# Patient Record
Sex: Female | Born: 1965 | Race: White | Hispanic: No | Marital: Married | Smoking: Never smoker
Health system: Southern US, Community
[De-identification: ages and names within clinical notes are randomized; demographics above are authoritative.]

## PROBLEM LIST (undated history)

## (undated) DIAGNOSIS — F32A Depression, unspecified: Secondary | ICD-10-CM

## (undated) DIAGNOSIS — F419 Anxiety disorder, unspecified: Secondary | ICD-10-CM

## (undated) DIAGNOSIS — F329 Major depressive disorder, single episode, unspecified: Secondary | ICD-10-CM

## (undated) DIAGNOSIS — Z8632 Personal history of gestational diabetes: Secondary | ICD-10-CM

## (undated) DIAGNOSIS — E7800 Pure hypercholesterolemia, unspecified: Secondary | ICD-10-CM

## (undated) HISTORY — DX: Depression, unspecified: F32.A

## (undated) HISTORY — DX: Major depressive disorder, single episode, unspecified: F32.9

## (undated) HISTORY — DX: Anxiety disorder, unspecified: F41.9

## (undated) HISTORY — DX: Personal history of gestational diabetes: Z86.32

## (undated) HISTORY — DX: Pure hypercholesterolemia, unspecified: E78.00

---

## 1998-12-23 ENCOUNTER — Inpatient Hospital Stay (HOSPITAL_COMMUNITY): Admission: AD | Admit: 1998-12-23 | Discharge: 1998-12-23 | Payer: Self-pay | Admitting: Gynecology

## 1999-01-25 ENCOUNTER — Encounter: Admission: RE | Admit: 1999-01-25 | Discharge: 1999-04-25 | Payer: Self-pay | Admitting: Gynecology

## 1999-04-23 ENCOUNTER — Encounter: Payer: Self-pay | Admitting: Obstetrics and Gynecology

## 1999-04-23 ENCOUNTER — Ambulatory Visit (HOSPITAL_COMMUNITY): Admission: RE | Admit: 1999-04-23 | Discharge: 1999-04-23 | Payer: Self-pay | Admitting: Obstetrics and Gynecology

## 1999-04-24 ENCOUNTER — Inpatient Hospital Stay (HOSPITAL_COMMUNITY): Admission: AD | Admit: 1999-04-24 | Discharge: 1999-04-27 | Payer: Self-pay | Admitting: Gynecology

## 1999-04-28 ENCOUNTER — Encounter: Admission: RE | Admit: 1999-04-28 | Discharge: 1999-07-27 | Payer: Self-pay | Admitting: Gynecology

## 1999-07-28 ENCOUNTER — Encounter: Admission: RE | Admit: 1999-07-28 | Discharge: 1999-10-15 | Payer: Self-pay | Admitting: Gynecology

## 2000-11-06 ENCOUNTER — Encounter: Admission: RE | Admit: 2000-11-06 | Discharge: 2001-02-04 | Payer: Self-pay | Admitting: *Deleted

## 2002-05-05 ENCOUNTER — Other Ambulatory Visit: Admission: RE | Admit: 2002-05-05 | Discharge: 2002-05-05 | Payer: Self-pay | Admitting: Gynecology

## 2004-02-21 ENCOUNTER — Other Ambulatory Visit: Admission: RE | Admit: 2004-02-21 | Discharge: 2004-02-21 | Payer: Self-pay | Admitting: Gynecology

## 2004-03-11 ENCOUNTER — Ambulatory Visit (HOSPITAL_COMMUNITY): Admission: RE | Admit: 2004-03-11 | Discharge: 2004-03-11 | Payer: Self-pay | Admitting: Gynecology

## 2005-08-27 ENCOUNTER — Other Ambulatory Visit: Admission: RE | Admit: 2005-08-27 | Discharge: 2005-08-27 | Payer: Self-pay | Admitting: Gynecology

## 2006-12-25 ENCOUNTER — Ambulatory Visit (HOSPITAL_COMMUNITY): Admission: RE | Admit: 2006-12-25 | Discharge: 2006-12-25 | Payer: Self-pay | Admitting: Gynecology

## 2006-12-25 ENCOUNTER — Other Ambulatory Visit: Admission: RE | Admit: 2006-12-25 | Discharge: 2006-12-25 | Payer: Self-pay | Admitting: Gynecology

## 2007-12-27 ENCOUNTER — Ambulatory Visit (HOSPITAL_COMMUNITY): Admission: RE | Admit: 2007-12-27 | Discharge: 2007-12-27 | Payer: Self-pay | Admitting: Gynecology

## 2008-01-04 ENCOUNTER — Encounter: Payer: Self-pay | Admitting: Gynecology

## 2008-01-04 ENCOUNTER — Other Ambulatory Visit: Admission: RE | Admit: 2008-01-04 | Discharge: 2008-01-04 | Payer: Self-pay | Admitting: Gynecology

## 2008-01-04 ENCOUNTER — Ambulatory Visit: Payer: Self-pay | Admitting: Gynecology

## 2008-07-11 ENCOUNTER — Other Ambulatory Visit: Admission: RE | Admit: 2008-07-11 | Discharge: 2008-07-11 | Payer: Self-pay | Admitting: Gynecology

## 2008-07-11 ENCOUNTER — Encounter: Payer: Self-pay | Admitting: Gynecology

## 2008-07-11 ENCOUNTER — Ambulatory Visit: Payer: Self-pay | Admitting: Gynecology

## 2008-12-29 ENCOUNTER — Ambulatory Visit (HOSPITAL_COMMUNITY): Admission: RE | Admit: 2008-12-29 | Discharge: 2008-12-29 | Payer: Self-pay | Admitting: Gynecology

## 2009-01-04 ENCOUNTER — Encounter: Payer: Self-pay | Admitting: Gynecology

## 2009-01-04 ENCOUNTER — Other Ambulatory Visit: Admission: RE | Admit: 2009-01-04 | Discharge: 2009-01-04 | Payer: Self-pay | Admitting: Gynecology

## 2009-01-04 ENCOUNTER — Ambulatory Visit: Payer: Self-pay | Admitting: Gynecology

## 2009-01-09 ENCOUNTER — Encounter: Admission: RE | Admit: 2009-01-09 | Discharge: 2009-01-09 | Payer: Self-pay | Admitting: Gynecology

## 2009-06-01 ENCOUNTER — Ambulatory Visit: Payer: Self-pay | Admitting: Gynecology

## 2010-01-15 ENCOUNTER — Ambulatory Visit: Payer: Self-pay | Admitting: Gynecology

## 2010-01-15 ENCOUNTER — Other Ambulatory Visit: Admission: RE | Admit: 2010-01-15 | Discharge: 2010-01-15 | Payer: Self-pay | Admitting: Gynecology

## 2010-01-18 ENCOUNTER — Ambulatory Visit (HOSPITAL_COMMUNITY): Admission: RE | Admit: 2010-01-18 | Discharge: 2010-01-18 | Payer: Self-pay | Admitting: Gynecology

## 2010-11-04 ENCOUNTER — Encounter: Payer: Self-pay | Admitting: *Deleted

## 2011-10-15 ENCOUNTER — Other Ambulatory Visit: Payer: Self-pay | Admitting: Gynecology

## 2011-10-15 DIAGNOSIS — Z1231 Encounter for screening mammogram for malignant neoplasm of breast: Secondary | ICD-10-CM

## 2011-10-23 ENCOUNTER — Other Ambulatory Visit (HOSPITAL_COMMUNITY)
Admission: RE | Admit: 2011-10-23 | Discharge: 2011-10-23 | Disposition: A | Discharge status: Home / Self Care | Payer: BC Managed Care – PPO | Source: Ambulatory Visit | Attending: Gynecology | Admitting: Gynecology

## 2011-10-23 ENCOUNTER — Encounter: Payer: Self-pay | Admitting: Gynecology

## 2011-10-23 ENCOUNTER — Ambulatory Visit (INDEPENDENT_AMBULATORY_CARE_PROVIDER_SITE_OTHER): Payer: BC Managed Care – PPO | Admitting: Gynecology

## 2011-10-23 VITALS — BP 126/68 | Ht 65.0 in | Wt 200.0 lb

## 2011-10-23 DIAGNOSIS — Z1159 Encounter for screening for other viral diseases: Secondary | ICD-10-CM | POA: Insufficient documentation

## 2011-10-23 DIAGNOSIS — Z01419 Encounter for gynecological examination (general) (routine) without abnormal findings: Secondary | ICD-10-CM

## 2011-10-23 NOTE — Patient Instructions (Signed)
Follow up in one year for annual gynecologic exam. 

## 2011-10-23 NOTE — Progress Notes (Signed)
Krista Walsh 09-Sep-1965 [id number]        46 y.o.  for annual exam.  Doing well without complaints.  Past medical history,surgical history, medications, allergies, family history and social history were all reviewed and documented in the EPIC chart. ROS:  Was performed and pertinent positives and negatives are included in the history.  Exam: Sherrilyn Rist assistant Filed Vitals:   10/23/11 1106  BP: 126/68   General appearance  Normal Skin grossly normal Head/Neck normal with no cervical or supraclavicular adenopathy thyroid normal Lungs  clear Cardiac RR, without RMG Abdominal  soft, nontender, without masses, organomegaly or hernia Breasts  examined lying and sitting without masses, retractions, discharge or axillary adenopathy. Pelvic  Ext/BUS/vagina  normal   Cervix  normal Pap/HPV  Uterus  anteverted, normal size, shape and contour, midline and mobile nontender   Adnexa  Without masses or tenderness    Anus and perineum  normal   Rectovaginal  normal sphincter tone without palpated masses or tenderness.    Assessment/Plan:  46 y.o. female for annual exam, regular menses.    1. Contraception. Patient using condoms. I again reviewed with her the failure risk and availability of Plan B. Alternatives discussed and offered and patient declines. 2. Pap smear. Last Pap smear 2011. Pap/HPV done. No history of abnormal Pap smears before. If this is normal then we'll plan every 5 year screening. 3. Mammography. Patient has scheduled in 2 weeks. SBE monthly reviewed. 4. Health maintenance. The lab work was done today as it is all done through her primary physician's office. Assuming she continues well from a gynecologic standpoint she will see me in a year, sooner as needed.    Dara Lords MD, 11:24 AM 10/23/2011

## 2011-11-11 ENCOUNTER — Ambulatory Visit (HOSPITAL_COMMUNITY)
Admission: RE | Admit: 2011-11-11 | Discharge: 2011-11-11 | Disposition: A | Discharge status: Home / Self Care | Payer: BC Managed Care – PPO | Source: Ambulatory Visit | Attending: Gynecology | Admitting: Gynecology

## 2011-11-11 DIAGNOSIS — Z1231 Encounter for screening mammogram for malignant neoplasm of breast: Secondary | ICD-10-CM | POA: Insufficient documentation

## 2013-01-30 IMAGING — MG MM DIGITAL SCREENING BILAT
4 series · 4 of 4 positions shown · non-contrast
Comparison: Previous exams

CLINICAL DATA: Screening.

DIGITAL SCREENING MAMMOGRAM WITH CAD

[R CC]
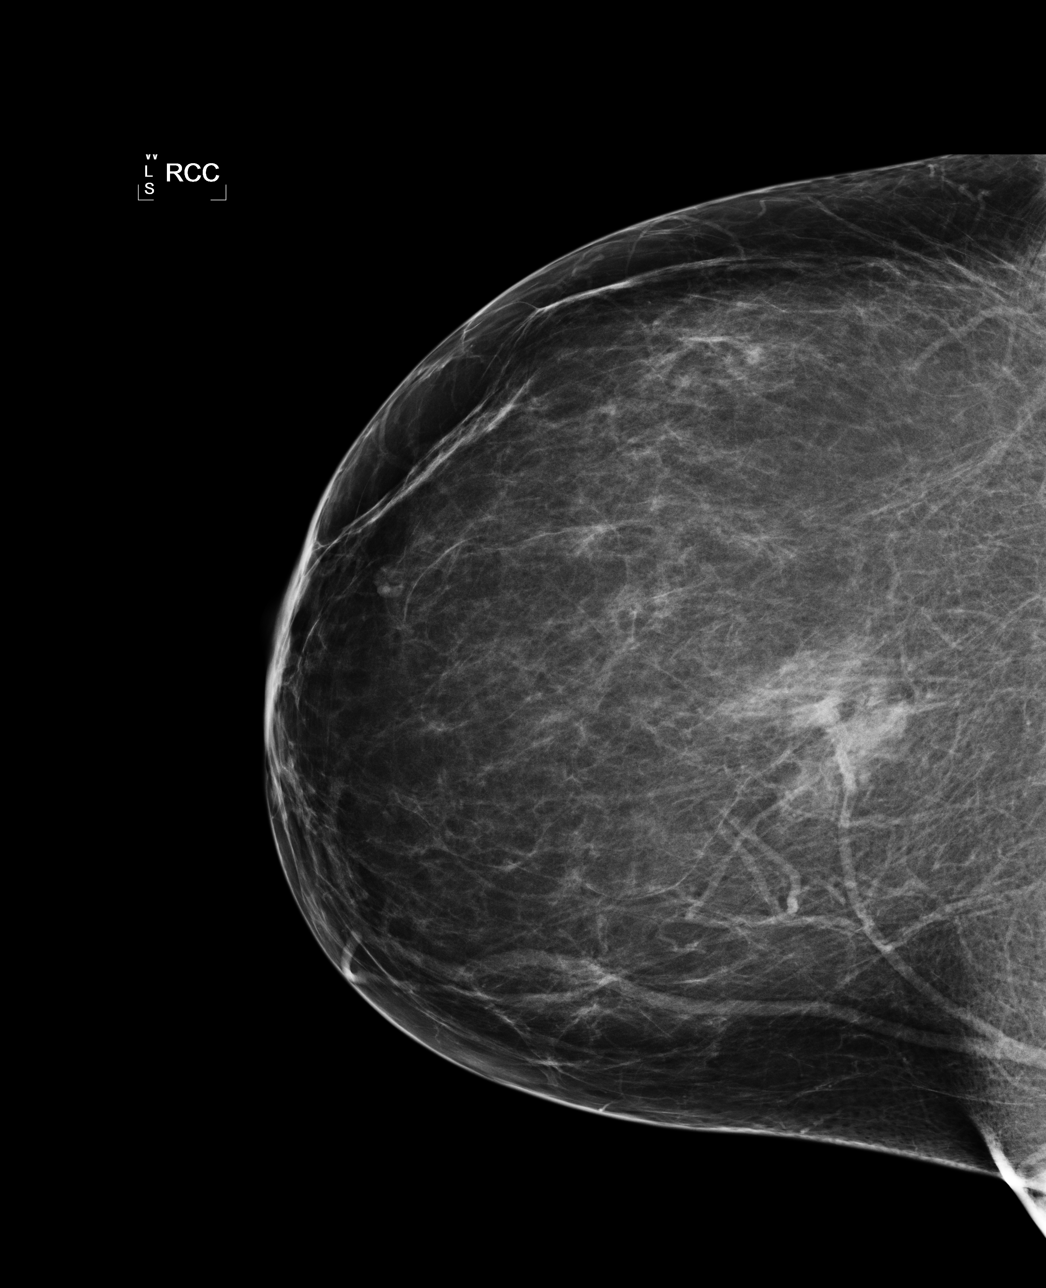

[R MLO]
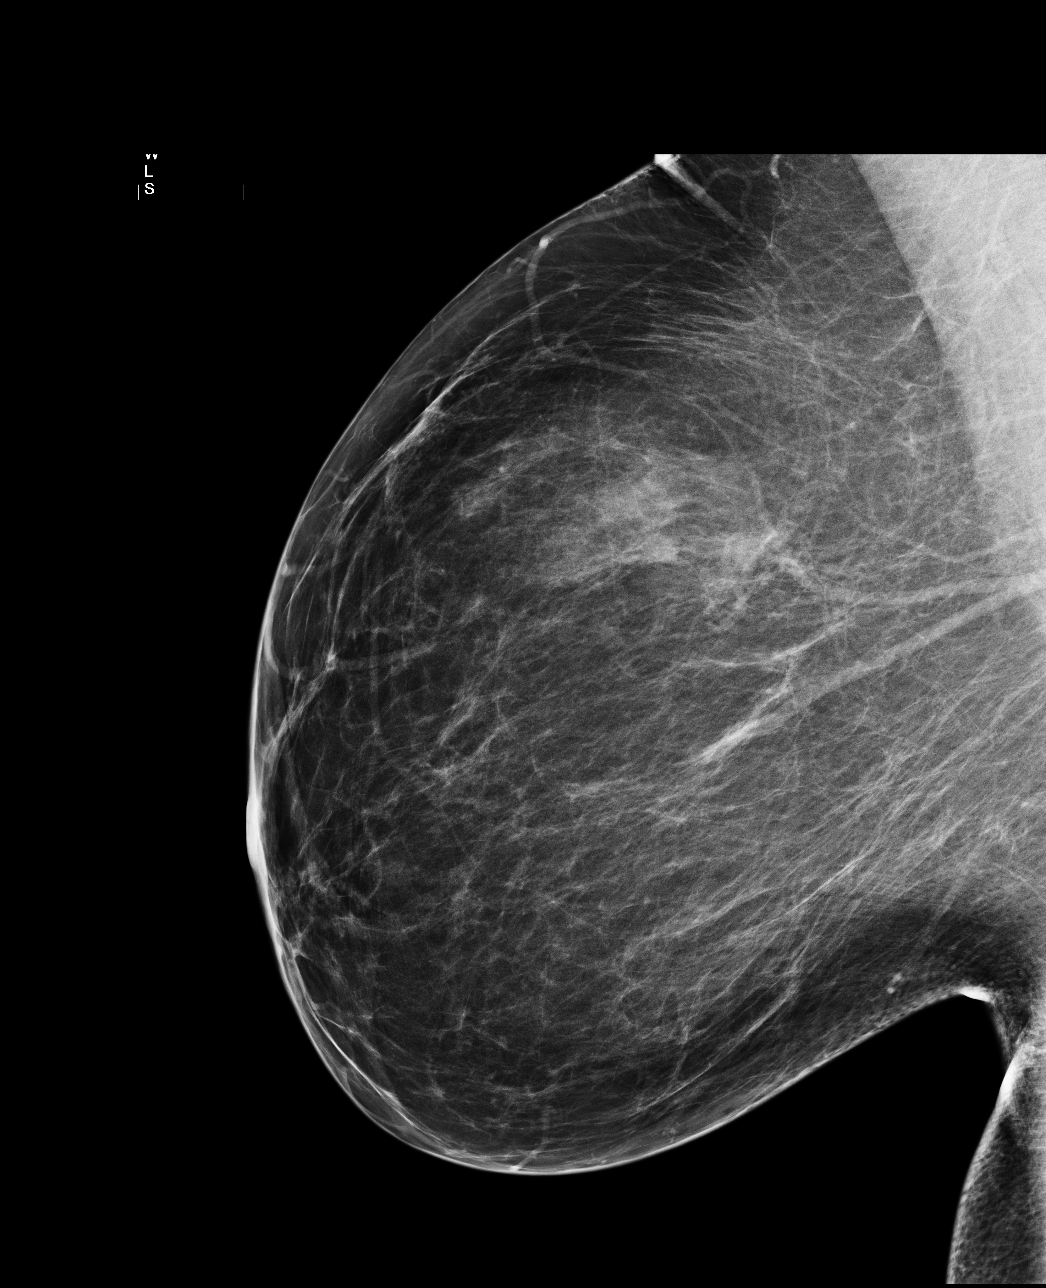

[L CC]
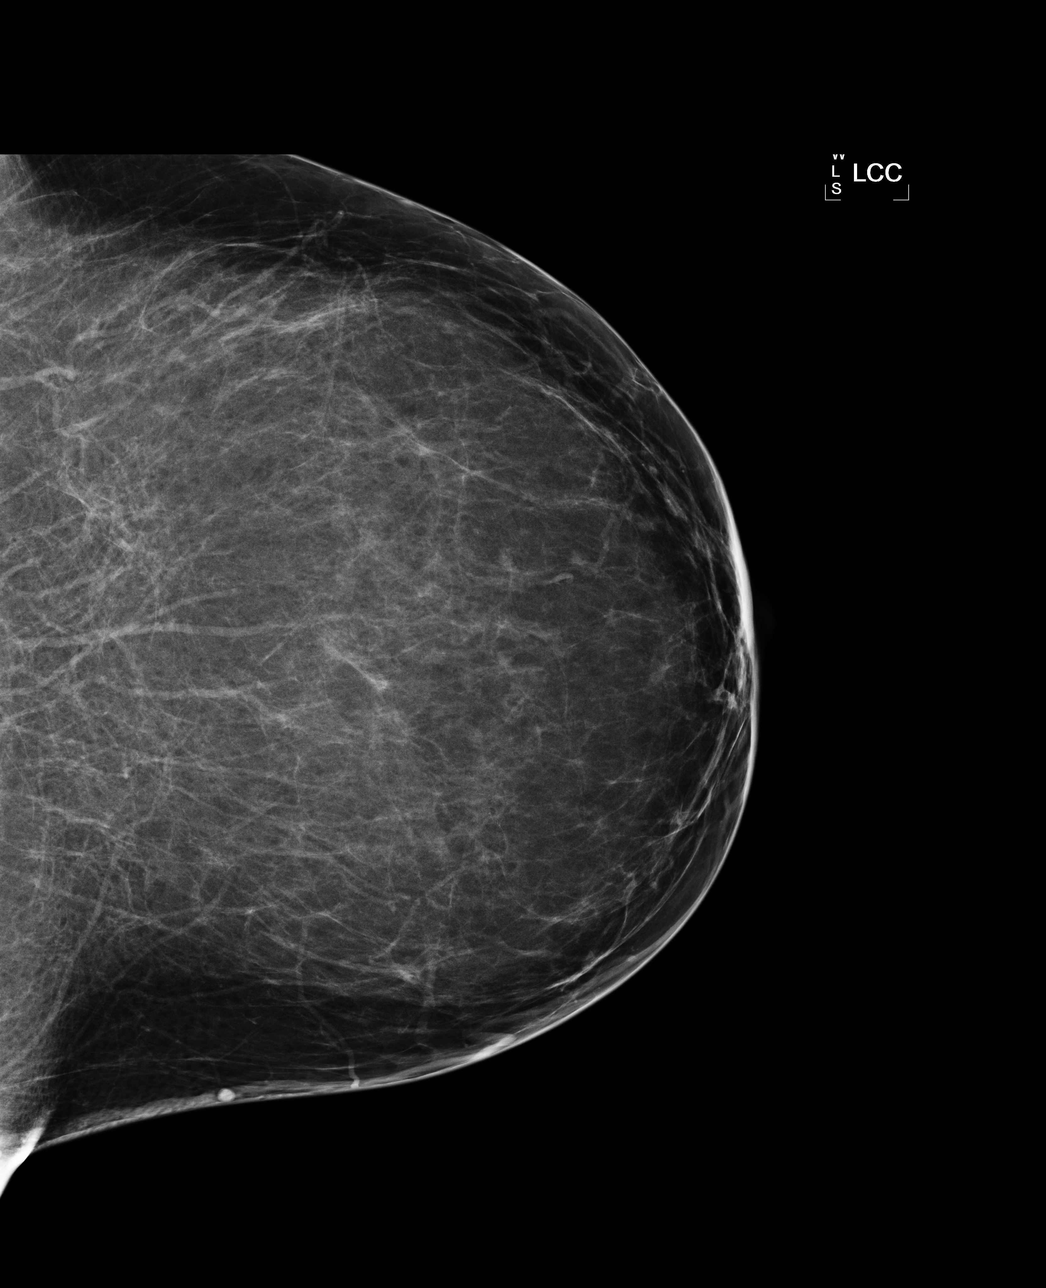

[L MLO]
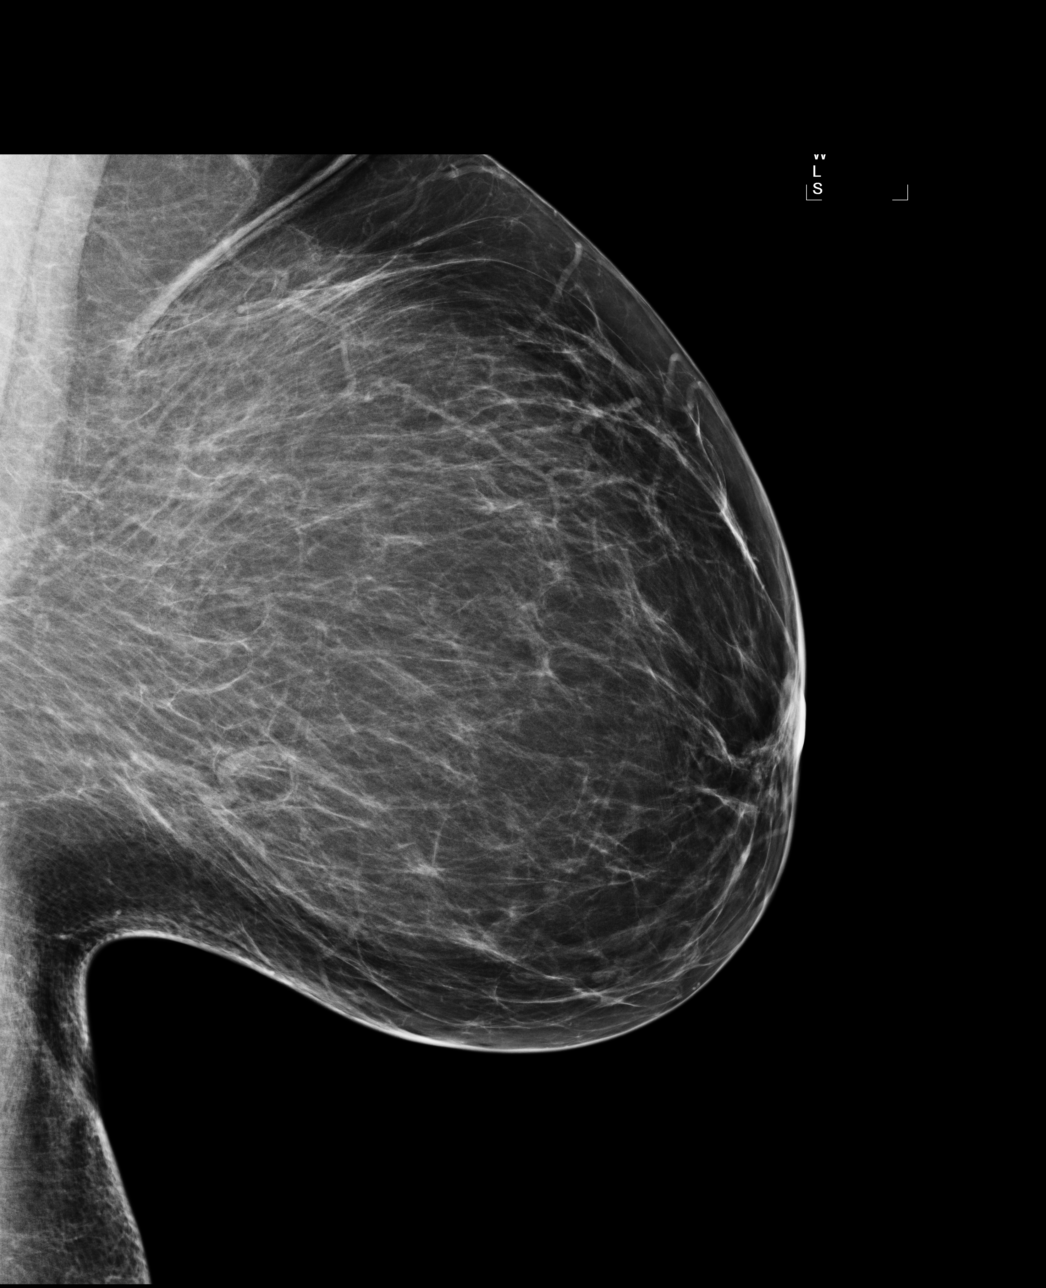

[4 of 4 positions shown; findings below may reference images not displayed]

FINDINGS: There are scattered fibroglandular densities. No
suspicious masses, architectural distortion, or calcifications are
present.

Images were processed with CAD.
IMPRESSION: No specific mammographic evidence of malignancy.

A result letter of this screening mammogram will be mailed directly
to the patient.

RECOMMENDATION:
Screening mammogram in one year. (Code:[id number])

BI-RADS CATEGORY 1:  Negative

## 2014-02-27 ENCOUNTER — Encounter: Payer: Self-pay | Admitting: Gynecology

## 2019-06-25 ENCOUNTER — Ambulatory Visit: Payer: Self-pay | Attending: Internal Medicine

## 2019-06-25 DIAGNOSIS — Z23 Encounter for immunization: Secondary | ICD-10-CM | POA: Insufficient documentation

## 2019-06-25 NOTE — Progress Notes (Signed)
   Covid-19 Vaccination Clinic  Name:  Krista Walsh    MRN: [id number] DOB: 10-06-1965  06/25/2019  Krista Walsh was observed post Covid-19 immunization for 15 minutes without incidence. She was provided with Vaccine Information Sheet and instruction to access the V-Safe system.   Krista Walsh was instructed to call 911 with any severe reactions post vaccine: Marland Kitchen Difficulty breathing  . Swelling of your face and throat  . A fast heartbeat  . A bad rash all over your body  . Dizziness and weakness    Immunizations Administered    Name Date Dose VIS Date Route   Pfizer COVID-19 Vaccine 06/25/2019 12:21 PM 0.3 mL 04/08/2019 Intramuscular   Manufacturer: [employer], [employer]   Lot: [id number]   NDC: [address]-2

## 2019-07-16 ENCOUNTER — Ambulatory Visit: Payer: Self-pay | Attending: Internal Medicine

## 2019-07-16 DIAGNOSIS — Z23 Encounter for immunization: Secondary | ICD-10-CM

## 2019-07-16 NOTE — Progress Notes (Signed)
   Covid-19 Vaccination Clinic  Name:  Krista Walsh    MRN: [id number] DOB: 1966-03-05  07/16/2019  Ms. [name] was observed post Covid-19 immunization for 15 minutes without incident. She was provided with Vaccine Information Sheet and instruction to access the V-Safe system.   Ms. [name] was instructed to call 911 with any severe reactions post vaccine: Marland Kitchen Difficulty breathing  . Swelling of face and throat  . A fast heartbeat  . A bad rash all over body  . Dizziness and weakness   Immunizations Administered    Name Date Dose VIS Date Route   Pfizer COVID-19 Vaccine 07/16/2019 11:33 AM 0.3 mL 04/08/2019 Intramuscular   Manufacturer: [employer], [employer]   Lot: [id number]   NDC: [address]-8

## 2019-07-20 ENCOUNTER — Ambulatory Visit: Payer: Self-pay

## 2023-02-09 ENCOUNTER — Other Ambulatory Visit: Payer: Self-pay | Admitting: Obstetrics and Gynecology

## 2023-02-09 DIAGNOSIS — R928 Other abnormal and inconclusive findings on diagnostic imaging of breast: Secondary | ICD-10-CM

## 2023-02-20 ENCOUNTER — Other Ambulatory Visit: Payer: Self-pay | Admitting: Obstetrics and Gynecology

## 2023-02-20 ENCOUNTER — Ambulatory Visit
Admission: RE | Admit: 2023-02-20 | Discharge: 2023-02-20 | Disposition: A | Discharge status: Home / Self Care | Payer: Self-pay | Source: Ambulatory Visit | Attending: Obstetrics and Gynecology | Admitting: Obstetrics and Gynecology

## 2023-02-20 DIAGNOSIS — N6489 Other specified disorders of breast: Secondary | ICD-10-CM

## 2023-02-20 DIAGNOSIS — R928 Other abnormal and inconclusive findings on diagnostic imaging of breast: Secondary | ICD-10-CM

## 2023-02-20 DIAGNOSIS — N6320 Unspecified lump in the left breast, unspecified quadrant: Secondary | ICD-10-CM

## 2023-10-16 ENCOUNTER — Ambulatory Visit
Admission: RE | Admit: 2023-10-16 | Discharge: 2023-10-16 | Disposition: A | Discharge status: Home / Self Care | Payer: Self-pay | Source: Ambulatory Visit | Attending: Obstetrics and Gynecology | Admitting: Obstetrics and Gynecology

## 2023-10-16 ENCOUNTER — Other Ambulatory Visit: Payer: Self-pay | Admitting: Obstetrics and Gynecology

## 2023-10-16 ENCOUNTER — Ambulatory Visit: Payer: Self-pay

## 2023-10-16 DIAGNOSIS — N6489 Other specified disorders of breast: Secondary | ICD-10-CM

## 2023-10-16 DIAGNOSIS — N6320 Unspecified lump in the left breast, unspecified quadrant: Secondary | ICD-10-CM

## 2024-04-18 ENCOUNTER — Ambulatory Visit
Admission: RE | Admit: 2024-04-18 | Discharge: 2024-04-18 | Disposition: A | Source: Ambulatory Visit | Attending: Obstetrics and Gynecology

## 2024-04-18 ENCOUNTER — Encounter: Payer: Self-pay | Admitting: Obstetrics and Gynecology

## 2024-04-18 ENCOUNTER — Other Ambulatory Visit: Payer: Self-pay | Admitting: Obstetrics and Gynecology

## 2024-04-18 ENCOUNTER — Inpatient Hospital Stay: Admission: RE | Admit: 2024-04-18 | Discharge: 2024-04-18 | Attending: Obstetrics and Gynecology

## 2024-04-18 DIAGNOSIS — N6320 Unspecified lump in the left breast, unspecified quadrant: Secondary | ICD-10-CM

## 2024-04-18 DIAGNOSIS — N6489 Other specified disorders of breast: Secondary | ICD-10-CM
